# Patient Record
Sex: Male | Born: 1975 | Race: Black or African American | Hispanic: No | Marital: Married | Smoking: Former smoker
Health system: Southern US, Community
[De-identification: ages and names within clinical notes are randomized; demographics above are authoritative.]

## PROBLEM LIST (undated history)

## (undated) DIAGNOSIS — T782XXA Anaphylactic shock, unspecified, initial encounter: Secondary | ICD-10-CM

## (undated) DIAGNOSIS — I10 Essential (primary) hypertension: Secondary | ICD-10-CM

## (undated) DIAGNOSIS — T63441A Toxic effect of venom of bees, accidental (unintentional), initial encounter: Secondary | ICD-10-CM

## (undated) HISTORY — PX: NO PAST SURGERIES: SHX2092

---

## 2007-11-29 ENCOUNTER — Emergency Department: Payer: Self-pay | Admitting: Emergency Medicine

## 2010-04-30 ENCOUNTER — Emergency Department: Payer: Self-pay | Admitting: Emergency Medicine

## 2011-09-27 ENCOUNTER — Emergency Department: Payer: Self-pay | Admitting: *Deleted

## 2011-10-28 ENCOUNTER — Emergency Department: Payer: Self-pay | Admitting: Emergency Medicine

## 2013-05-26 IMAGING — CR DG LUMBAR SPINE 2-3V
1 series · 3 of 3 positions shown · non-contrast
Comparison: none

REASON FOR EXAM: pain
COMMENTS:

PROCEDURE:     DXR - DXR LUMBAR SPINE AP AND LATERAL  - September 27, 2011 [DATE]
RESULT:     Comparison: None

[Series 1: view not recorded · 0.17mm/px · 3 of 3 slices shown]
[im 1/3]
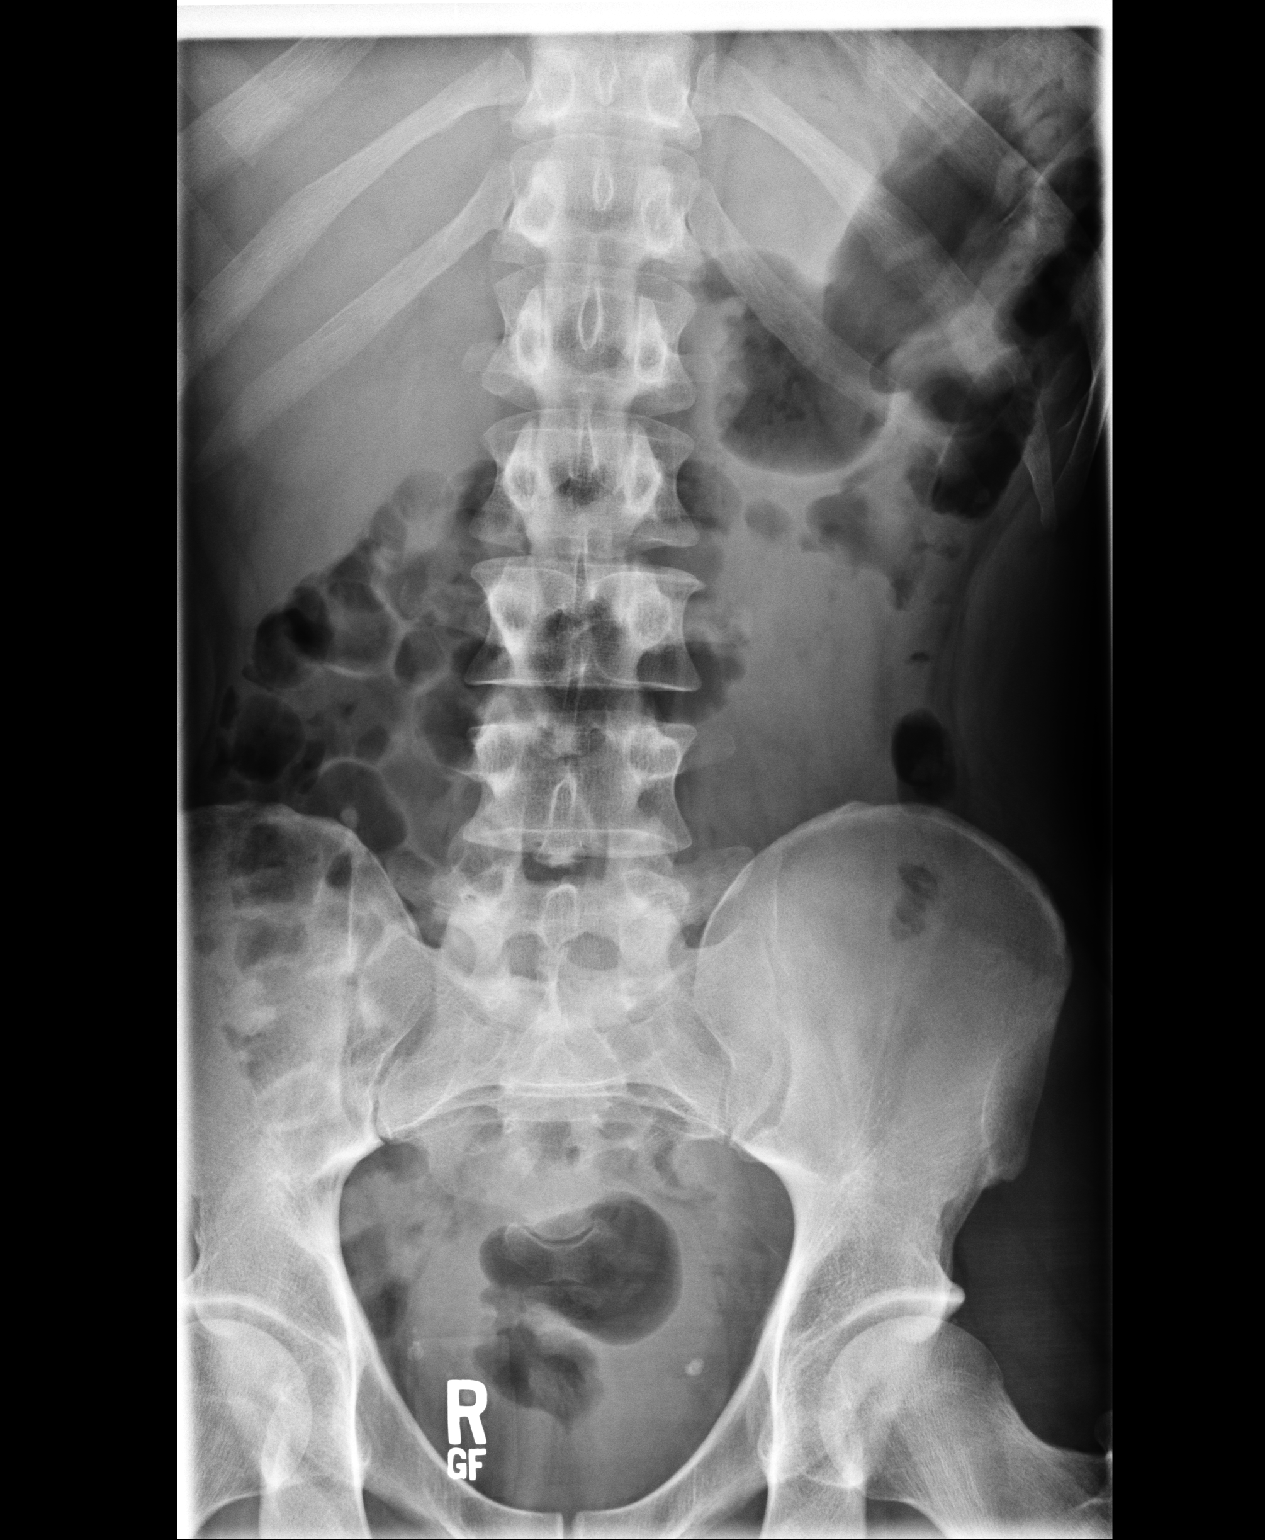
[im 2/3]
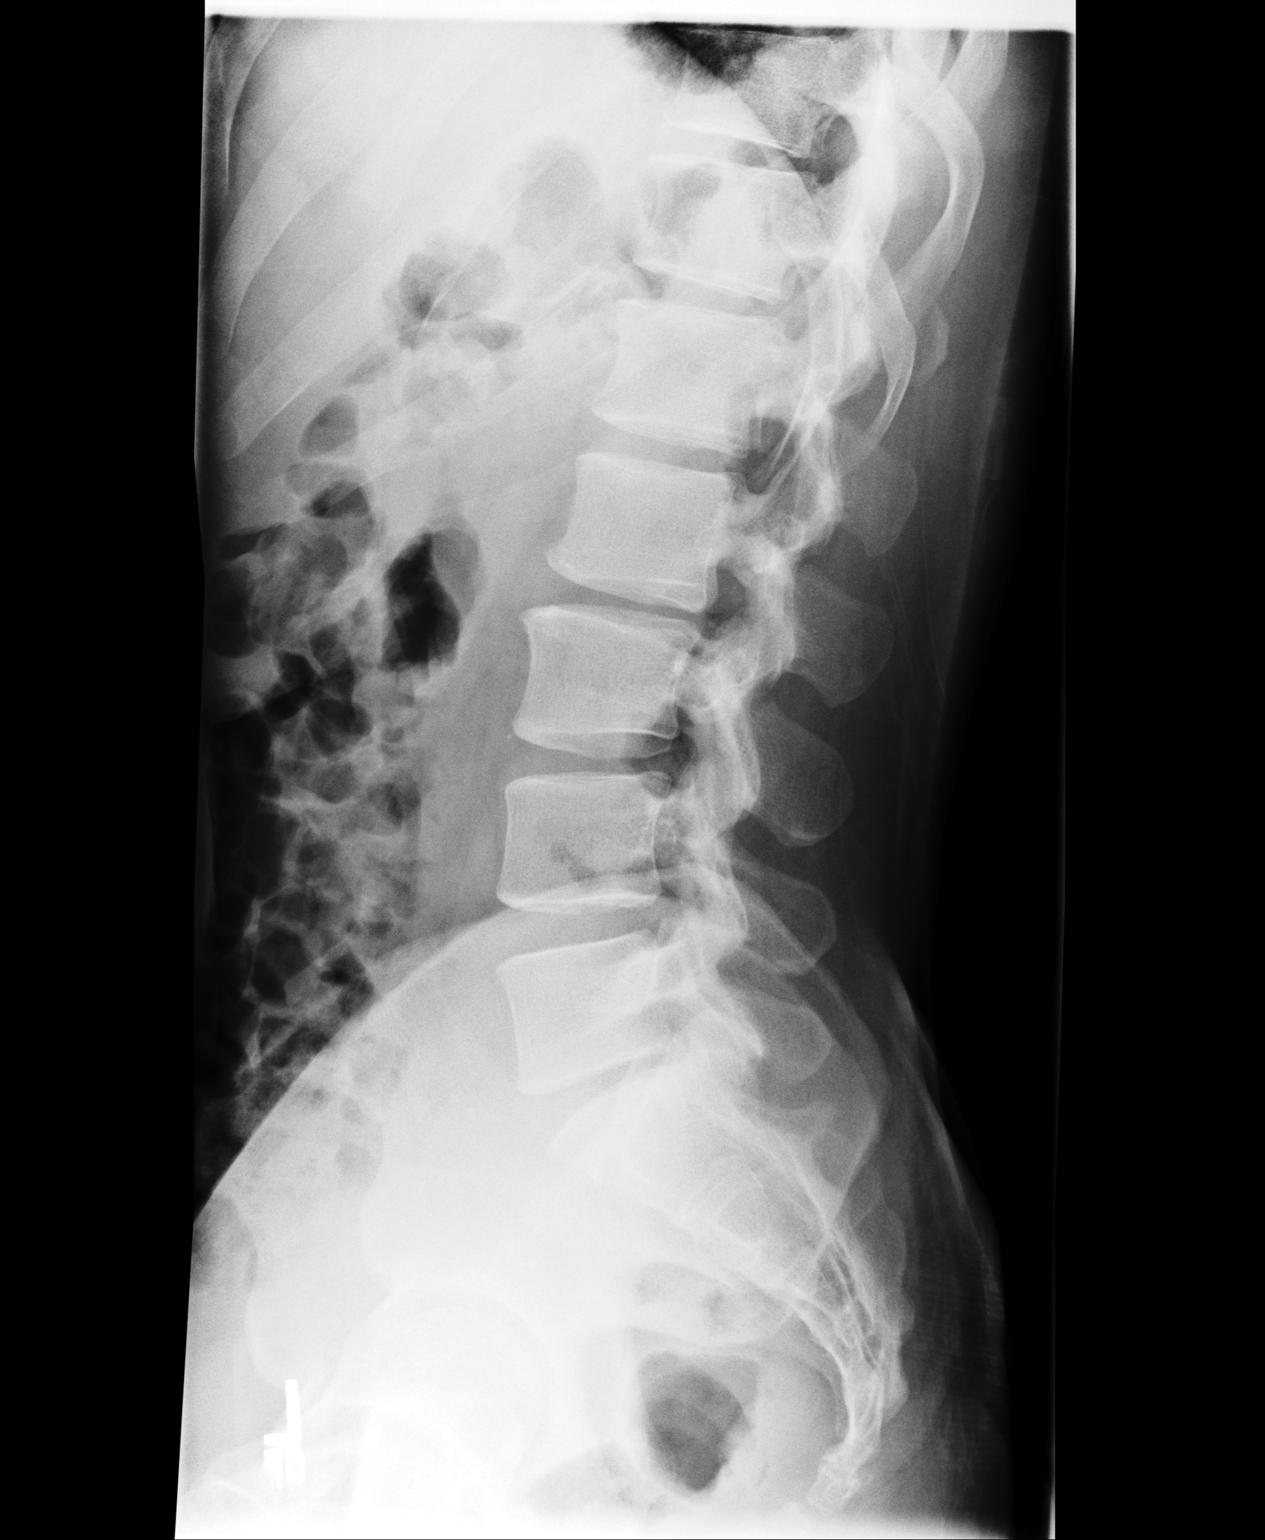
[im 3/3]
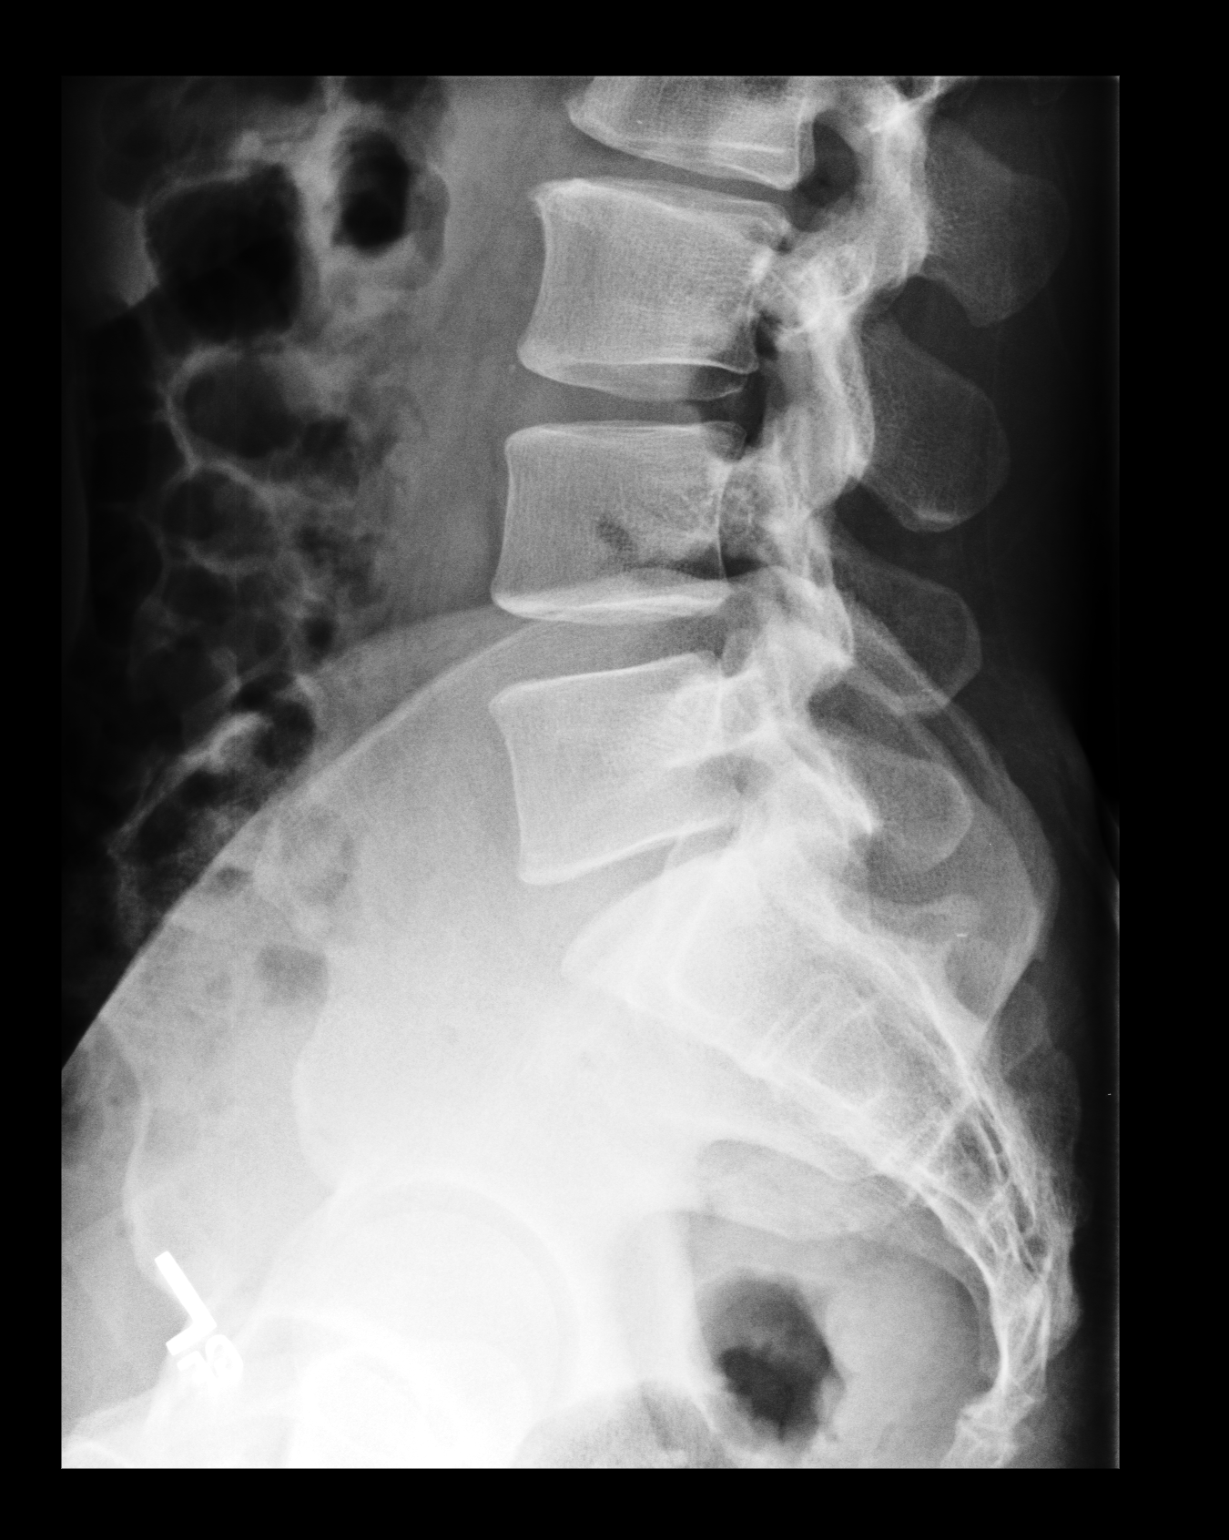

[3 of 3 positions shown; findings below may reference images not displayed]

FINDINGS: AP and lateral views of the lumbar spine and a coned down view of the
lumbosacral junction are provided.

There are 5 nonrib bearing lumbar-type vertebral bodies. The vertebral body
heights are maintained. The alignment is anatomic. There is no
spondylolysis. There is no acute fracture or static listhesis. The disc
spaces are maintained.

The SI joints are unremarkable.
IMPRESSION: 1. No acute osseous abnormality of the lumbar spine.

## 2014-01-04 ENCOUNTER — Emergency Department: Payer: Self-pay | Admitting: Emergency Medicine

## 2014-01-04 LAB — BASIC METABOLIC PANEL
ANION GAP: 6 — AB (ref 7–16)
BUN: 14 mg/dL (ref 7–18)
CO2: 28 mmol/L (ref 21–32)
Calcium, Total: 9.2 mg/dL (ref 8.5–10.1)
Chloride: 102 mmol/L (ref 98–107)
Creatinine: 1.2 mg/dL (ref 0.60–1.30)
EGFR (African American): >60
GLUCOSE: 85 mg/dL (ref 65–99)
Osmolality: 272 (ref 275–301)
Potassium: 3.7 mmol/L (ref 3.5–5.1)
SODIUM: 136 mmol/L (ref 136–145)

## 2014-01-04 LAB — CBC
HCT: 41.9 % (ref 40.0–52.0)
HGB: 13.4 g/dL (ref 13.0–18.0)
MCH: 28.9 pg (ref 26.0–34.0)
MCHC: 32 g/dL (ref 32.0–36.0)
MCV: 90 fL (ref 80–100)
Platelet: 159 10*3/uL (ref 150–440)
RBC: 4.65 10*6/uL (ref 4.40–5.90)
RDW: 14.6 % — AB (ref 11.5–14.5)
WBC: 7.7 10*3/uL (ref 3.8–10.6)

## 2014-01-04 LAB — TROPONIN I: Troponin-I: <0.02 ng/mL

## 2015-01-01 ENCOUNTER — Emergency Department: Payer: Self-pay | Admitting: Emergency Medicine

## 2015-12-21 ENCOUNTER — Observation Stay
Admission: EM | Admit: 2015-12-21 | Discharge: 2015-12-22 | Disposition: A | Discharge status: Home / Self Care | Payer: Self-pay | Attending: Cardiovascular Disease | Admitting: Cardiovascular Disease

## 2015-12-21 ENCOUNTER — Encounter: Payer: Self-pay | Admitting: Emergency Medicine

## 2015-12-21 ENCOUNTER — Emergency Department: Payer: Self-pay

## 2015-12-21 DIAGNOSIS — Z79899 Other long term (current) drug therapy: Secondary | ICD-10-CM | POA: Insufficient documentation

## 2015-12-21 DIAGNOSIS — I959 Hypotension, unspecified: Secondary | ICD-10-CM | POA: Insufficient documentation

## 2015-12-21 DIAGNOSIS — R079 Chest pain, unspecified: Secondary | ICD-10-CM

## 2015-12-21 DIAGNOSIS — M79601 Pain in right arm: Secondary | ICD-10-CM | POA: Insufficient documentation

## 2015-12-21 DIAGNOSIS — Z87891 Personal history of nicotine dependence: Secondary | ICD-10-CM | POA: Insufficient documentation

## 2015-12-21 DIAGNOSIS — M25511 Pain in right shoulder: Secondary | ICD-10-CM | POA: Insufficient documentation

## 2015-12-21 DIAGNOSIS — R7989 Other specified abnormal findings of blood chemistry: Secondary | ICD-10-CM

## 2015-12-21 DIAGNOSIS — R0602 Shortness of breath: Secondary | ICD-10-CM | POA: Insufficient documentation

## 2015-12-21 DIAGNOSIS — Z91030 Bee allergy status: Secondary | ICD-10-CM | POA: Insufficient documentation

## 2015-12-21 DIAGNOSIS — R937 Abnormal findings on diagnostic imaging of other parts of musculoskeletal system: Secondary | ICD-10-CM | POA: Insufficient documentation

## 2015-12-21 DIAGNOSIS — R0789 Other chest pain: Secondary | ICD-10-CM | POA: Insufficient documentation

## 2015-12-21 DIAGNOSIS — S4990XA Unspecified injury of shoulder and upper arm, unspecified arm, initial encounter: Secondary | ICD-10-CM

## 2015-12-21 DIAGNOSIS — R778 Other specified abnormalities of plasma proteins: Secondary | ICD-10-CM | POA: Insufficient documentation

## 2015-12-21 DIAGNOSIS — Z833 Family history of diabetes mellitus: Secondary | ICD-10-CM | POA: Insufficient documentation

## 2015-12-21 DIAGNOSIS — R55 Syncope and collapse: Principal | ICD-10-CM | POA: Insufficient documentation

## 2015-12-21 DIAGNOSIS — Z823 Family history of stroke: Secondary | ICD-10-CM | POA: Insufficient documentation

## 2015-12-21 HISTORY — DX: Toxic effect of venom of bees, accidental (unintentional), initial encounter: T63.441A

## 2015-12-21 HISTORY — DX: Anaphylactic shock, unspecified, initial encounter: T78.2XXA

## 2015-12-21 LAB — BASIC METABOLIC PANEL
Anion gap: 10 (ref 5–15)
BUN: 11 mg/dL (ref 6–20)
CHLORIDE: 109 mmol/L (ref 101–111)
CO2: 23 mmol/L (ref 22–32)
Calcium: 8.8 mg/dL — ABNORMAL LOW (ref 8.9–10.3)
Creatinine, Ser: 0.98 mg/dL (ref 0.61–1.24)
GFR calc Af Amer: >60 mL/min (ref >60)
GFR calc non Af Amer: >60 mL/min (ref >60)
GLUCOSE: 88 mg/dL (ref 65–99)
POTASSIUM: 3.4 mmol/L — AB (ref 3.5–5.1)
Sodium: 142 mmol/L (ref 135–145)

## 2015-12-21 LAB — CBC WITH DIFFERENTIAL/PLATELET
Basophils Absolute: 0.1 10*3/uL (ref 0–0.1)
Basophils Relative: 1 %
Eosinophils Absolute: 0 10*3/uL (ref 0–0.7)
Eosinophils Relative: 0 %
HEMATOCRIT: 39.7 % — AB (ref 40.0–52.0)
HEMOGLOBIN: 12.7 g/dL — AB (ref 13.0–18.0)
LYMPHS ABS: 2.6 10*3/uL (ref 1.0–3.6)
LYMPHS PCT: 20 %
MCH: 27.8 pg (ref 26.0–34.0)
MCHC: 32.1 g/dL (ref 32.0–36.0)
MCV: 86.6 fL (ref 80.0–100.0)
Monocytes Absolute: 1.4 10*3/uL — ABNORMAL HIGH (ref 0.2–1.0)
Monocytes Relative: 11 %
NEUTROS ABS: 8.6 10*3/uL — AB (ref 1.4–6.5)
NEUTROS PCT: 68 %
Platelets: 175 10*3/uL (ref 150–440)
RBC: 4.59 MIL/uL (ref 4.40–5.90)
RDW: 14.1 % (ref 11.5–14.5)
WBC: 12.7 10*3/uL — AB (ref 3.8–10.6)

## 2015-12-21 LAB — TROPONIN I: Troponin I: 0.07 ng/mL — ABNORMAL HIGH (ref <0.031)

## 2015-12-21 MED ORDER — HYDROCODONE-ACETAMINOPHEN 5-325 MG PO TABS: 1.0000 | ORAL_TABLET | ORAL | Status: DC | PRN

## 2015-12-21 MED ORDER — ONDANSETRON HCL 4 MG/2ML IJ SOLN
4.0000 mg | Freq: Once | INTRAMUSCULAR | Status: AC
  Administered 2015-12-21: 4 mg via INTRAVENOUS
  Filled 2015-12-21: qty 2

## 2015-12-21 MED ORDER — SODIUM CHLORIDE 0.9 % IV BOLUS (SEPSIS)
1000.0000 mL | Freq: Once | INTRAVENOUS | Status: AC
  Administered 2015-12-21: 1000 mL via INTRAVENOUS

## 2015-12-21 MED ORDER — ASPIRIN 81 MG PO CHEW
324.0000 mg | CHEWABLE_TABLET | Freq: Once | ORAL | Status: AC
  Administered 2015-12-21: 324 mg via ORAL
  Filled 2015-12-21: qty 4

## 2015-12-21 MED ORDER — HYDROMORPHONE HCL 1 MG/ML IJ SOLN
0.5000 mg | Freq: Once | INTRAMUSCULAR | Status: AC
  Administered 2015-12-21: 0.5 mg via INTRAVENOUS
  Filled 2015-12-21: qty 1

## 2015-12-21 MED ORDER — IBUPROFEN 800 MG PO TABS: 800.0000 mg | ORAL_TABLET | Freq: Three times a day (TID) | ORAL | Status: DC | PRN

## 2015-12-21 MED ORDER — TETANUS-DIPHTH-ACELL PERTUSSIS 5-2.5-18.5 LF-MCG/0.5 IM SUSP
0.5000 mL | Freq: Once | INTRAMUSCULAR | Status: AC
  Administered 2015-12-21: 0.5 mL via INTRAMUSCULAR
  Filled 2015-12-21: qty 0.5
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
## 2015-12-21 NOTE — ED Notes (Signed)
Pt had a near syncopal episode in lobby. Pt is diaphoretic and cool to touch.

## 2015-12-22 ENCOUNTER — Encounter: Payer: Self-pay | Admitting: Internal Medicine

## 2015-12-22 DIAGNOSIS — R55 Syncope and collapse: Secondary | ICD-10-CM | POA: Diagnosis present

## 2015-12-22 DIAGNOSIS — R778 Other specified abnormalities of plasma proteins: Secondary | ICD-10-CM | POA: Diagnosis present

## 2015-12-22 DIAGNOSIS — R7989 Other specified abnormal findings of blood chemistry: Secondary | ICD-10-CM

## 2015-12-22 DIAGNOSIS — M25511 Pain in right shoulder: Secondary | ICD-10-CM | POA: Diagnosis present

## 2015-12-22 LAB — TROPONIN I
TROPONIN I: 0.07 ng/mL — AB (ref <0.031)
Troponin I: 0.05 ng/mL — ABNORMAL HIGH (ref <0.031)

## 2015-12-22 MED ORDER — ONDANSETRON HCL 4 MG PO TABS: 4.0000 mg | ORAL_TABLET | Freq: Four times a day (QID) | ORAL | Status: DC | PRN

## 2015-12-22 MED ORDER — ONDANSETRON HCL 4 MG/2ML IJ SOLN
4.0000 mg | Freq: Four times a day (QID) | INTRAMUSCULAR | Status: DC | PRN
  Administered 2015-12-22 (×2): 4 mg via INTRAVENOUS
  Filled 2015-12-22 (×2): qty 2

## 2015-12-22 MED ORDER — MORPHINE SULFATE (PF) 2 MG/ML IV SOLN
2.0000 mg | INTRAVENOUS | Status: DC | PRN
  Administered 2015-12-22 (×3): 2 mg via INTRAVENOUS
  Filled 2015-12-22 (×3): qty 1
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
## 2015-12-22 NOTE — ED Notes (Signed)
Report given to [place], Charity fundraiser. Explained to oncoming nurse that patient was to be D/C per Dr. Park Breed at this time. Passed on in report that patient received pain prescriptions with prior nights D/C.

## 2015-12-22 NOTE — ED Provider Notes (Signed)
-----------------------------------------   1:44 PM on 12/22/2015 -----------------------------------------  Patient return to emergency department waiting room asking for a doctor's note for his probation officer. He is in a right sided shoulder sling. Per nursing he is asking for a 6 week restriction note. He has appointments for follow-up with cardiology as well as orthopedics. I will give a doctor's note for restriction until he is seen by these physicians and been cleared to return to work.  Myrna Blazer, MD 12/22/15 1345

## 2015-12-22 NOTE — ED Notes (Signed)
Pt is still in ED. Per charge nurse, pt will be here until morning.

## 2015-12-22 NOTE — ED Notes (Signed)
Dr. Kahn at bedside at this time.
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
## 2015-12-22 NOTE — ED Notes (Signed)
Per telemetry, floor is full and they are unable to accept pt. AC notified.
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
## 2016-12-14 ENCOUNTER — Emergency Department
Admission: EM | Admit: 2016-12-14 | Discharge: 2016-12-14 | Disposition: A | Discharge status: Home / Self Care | Payer: Self-pay | Attending: Emergency Medicine | Admitting: Emergency Medicine

## 2016-12-14 ENCOUNTER — Encounter: Payer: Self-pay | Admitting: Emergency Medicine

## 2016-12-14 DIAGNOSIS — Z87891 Personal history of nicotine dependence: Secondary | ICD-10-CM | POA: Insufficient documentation

## 2016-12-14 DIAGNOSIS — R69 Illness, unspecified: Secondary | ICD-10-CM

## 2016-12-14 DIAGNOSIS — R0981 Nasal congestion: Secondary | ICD-10-CM | POA: Insufficient documentation

## 2016-12-14 DIAGNOSIS — M25562 Pain in left knee: Secondary | ICD-10-CM | POA: Insufficient documentation

## 2016-12-14 DIAGNOSIS — R509 Fever, unspecified: Secondary | ICD-10-CM | POA: Insufficient documentation

## 2016-12-14 DIAGNOSIS — J111 Influenza due to unidentified influenza virus with other respiratory manifestations: Secondary | ICD-10-CM

## 2016-12-14 DIAGNOSIS — M25561 Pain in right knee: Secondary | ICD-10-CM | POA: Insufficient documentation

## 2016-12-14 DIAGNOSIS — R05 Cough: Secondary | ICD-10-CM | POA: Insufficient documentation

## 2016-12-14 LAB — INFLUENZA PANEL BY PCR (TYPE A & B)
Influenza A By PCR: NEGATIVE
Influenza B By PCR: NEGATIVE

## 2016-12-14 MED ORDER — NAPROXEN 500 MG PO TBEC: 500.0000 mg | DELAYED_RELEASE_TABLET | Freq: Two times a day (BID) | ORAL | 0 refills | Status: AC

## 2016-12-14 NOTE — ED Provider Notes (Signed)
[facility] Emergency Department Provider Note  ____________________________________________  Time seen: Approximately 8:49 PM  I have reviewed the triage vital signs and the nursing notes.   HISTORY  Chief Complaint Knee Pain    HPI Chase Henderson. is a 41 y.o. male presenting to the emergency department with bilateral arthralgias of the knees. He states that "a few days ago they seemed swollen". Patient states that he has also had fever, persistent headache, nonproductive cough, congestion and runny nose for 1.5 weeks. He states that he has been resting more than usual. Patient does not have a history of chronic knee pain. He rates his knee pain at 6 out of 10 in intensity and describes it as "stiff". Patient has taken ibuprofen for relief. He denies falls or traumas to the knees. He works in Production designer, theatre/television/film. Patient denies chest pain, shortness of breath, chest tightness, abdominal pain, vomiting or diarrhea. Patient states that he had nausea when symptoms first started.   Past Medical History:  Diagnosis Date  . Bee sting-induced anaphylaxis     Patient Active Problem List   Diagnosis Date Noted  . Right shoulder pain 12/22/2015  . Elevated troponin 12/22/2015  . Near syncope 12/22/2015    Past Surgical History:  Procedure Laterality Date  . NO PAST SURGERIES      Prior to Admission medications   Medication Sig Start Date End Date Taking? Authorizing Provider  naproxen (EC NAPROSYN) 500 MG EC tablet Take 1 tablet (500 mg total) by mouth 2 (two) times daily with a meal. 12/14/16 12/21/16  Orvil Feil, PA-C    Allergies Bee venom  Family History  Problem Relation Age of Onset  . Stroke    . Diabetes      Social History Social History  Substance Use Topics  . Smoking status: Former Games developer  . Smokeless tobacco: Former Neurosurgeon  . Alcohol use Yes    Review of Systems  Constitutional: Patient has had fever.  ENT: Patient has had congestion.   Cardiovascular: no chest pain. Respiratory: Patient has had non-productive cough.  No SOB. Gastrointestinal: Has had nausea. Denies diarrhea or constipation. Genitourinary: Negative for dysuria. No hematuria Musculoskeletal: Patient has arthralgias of the knees bilaterally. Skin: Negative for rash, abrasions, lacerations, ecchymosis. Neurological: Negative for headaches, focal weakness or numbness.   ____________________________________________   PHYSICAL EXAM:  VITAL SIGNS: ED Triage Vitals  Enc Vitals Group     BP 12/14/16 1949 (!) 149/92     Pulse Rate 12/14/16 1949 90     Resp 12/14/16 1949 18     Temp 12/14/16 1949 98 F (36.7 C)     Temp Source 12/14/16 1949 Oral     SpO2 12/14/16 1949 96 %     Weight 12/14/16 1950 175 lb (79.4 kg)     Height 12/14/16 1950 6' (1.829 m)     Head Circumference --      Peak Flow --      Pain Score 12/14/16 1950 6     Pain Loc --      Pain Edu? --      Excl. in GC? --     Constitutional: Alert and oriented. Patient was ambulating around the room when I entered. Eyes: Conjunctivae are normal. PERRL. EOMI. Head: Atraumatic. ENT:      Ears: Tympanic membranes are injected without evidence of effusion or purulent exudate. Bony landmarks are visualized bilaterally. No pain with palpation at the tragus.      Nose:  Nasal turbinates are edematous and erythematous. Trace rhinorrhea visualized.      Mouth/Throat: Mucous membranes are moist. Posterior pharynx is mildly erythematous. No tonsillar hypertrophy or purulent exudate. Uvula is midline. Neck: Full range of motion. No pain is elicited with flexion at the neck. Musculoskeletal: Patient's knees are symmetrical and without effusion bilaterally. Patient has 5 out of 5 strength in the lower extremities bilaterally. Patient is able to perform full range of motion at the hip, knee and ankle bilaterally. Patient has no tenderness to palpation along the medial and lateral joint line of the knee  bilaterally. Negative anterior drawer, posterior drawer, ballottement and McMurray's bilaterally. Palpable dorsalis pedis pulses bilaterally. Neurologic:  Normal speech and language. No gross focal neurologic deficits are appreciated. Cranial nerves: 2-10 normal as tested. No sensory loss or abnormal reflexes. Hematological/Lymphatic/Immunilogical: No cervical lymphadenopathy. Cardiovascular: Normal rate, regular rhythm. Normal S1 and S2.  Good peripheral circulation. Respiratory: Normal respiratory effort without tachypnea or retractions. Lungs CTAB. Good air entry to the bases with no decreased or absent breath sounds. Gastrointestinal: Bowel sounds 4 quadrants. Soft and nontender to palpation. No guarding or rigidity. No palpable masses. No distention. No CVA tenderness.  Skin:  Skin is warm, dry and intact. No erythema or edema of the skin overlying the knees. Psychiatric: Mood and affect are normal. Speech and behavior are normal. Patient exhibits appropriate insight and judgement. ____________________________________________   LABS (all labs ordered are listed, but only abnormal results are displayed)  Labs Reviewed  INFLUENZA PANEL BY PCR (TYPE A & B, H1N1)   ____________________________________________  EKG   ____________________________________________  RADIOLOGY   No results found.  ____________________________________________    PROCEDURES  Procedure(s) performed:    Procedures    Medications - No data to display   ____________________________________________   INITIAL IMPRESSION / ASSESSMENT AND PLAN / ED COURSE  Pertinent labs & imaging results that were available during my care of the patient were reviewed by me and considered in my medical decision making (see chart for details).  Review of the  CSRS was performed in accordance of the N[facility] prior to dispensing any controlled drugs.  Clinical Course     Assessment and Plan:  Knee arthralgias  secondary to influenza Patient presents to the emergency department with bilateral knee pain. Additionally, patient states that he has had headache, congestion, rhinorrhea, fatigue and body aches for 1-1/2 weeks. Patient does not have a history of trauma or chronic knee pain. I suspect arthralgias secondary to influenza. Patient was discharged with naproxen for knee pain. Patient education was provided regarding the course of influenza. Rest and hydration were encouraged. Patient was advised to follow up with orthopedics in 7 days if bilateral knee pain persists. Vital signs are reassuring at this time. All patient questions were answered.    ____________________________________________  FINAL CLINICAL IMPRESSION(S) / ED DIAGNOSES  Final diagnoses:  Influenza-like illness  Acute pain of both knees      NEW MEDICATIONS STARTED DURING THIS VISIT:  New Prescriptions   NAPROXEN (EC NAPROSYN) 500 MG EC TABLET    Take 1 tablet (500 mg total) by mouth 2 (two) times daily with a meal.        This chart was dictated using voice recognition software/Dragon. Despite best efforts to proofread, errors can occur which can change the meaning. Any change was purely unintentional.    Orvil FeilJaclyn M Canyon Willow, PA-C 12/14/16 2133    Minna AntisKevin Paduchowski, MD 12/16/16 2300

## 2016-12-14 NOTE — ED Notes (Signed)
Pt c/o bil knee pain chronic with no injury. Has not taken any tylenol or motrin for pain today. States had fluid in his knee yesterday but it went away.

## 2016-12-14 NOTE — ED Triage Notes (Signed)
Pt c/o bilateral knee pain for weeks; says he stands a lot for his job; a few days ago both knees were swollen and work but denies swelling at this time; no injury; pain worse with movement

## 2016-12-16 ENCOUNTER — Encounter: Payer: Self-pay | Admitting: Emergency Medicine

## 2016-12-16 ENCOUNTER — Emergency Department
Admission: EM | Admit: 2016-12-16 | Discharge: 2016-12-16 | Disposition: A | Discharge status: Home / Self Care | Payer: Self-pay | Attending: Emergency Medicine | Admitting: Emergency Medicine

## 2016-12-16 DIAGNOSIS — M25462 Effusion, left knee: Secondary | ICD-10-CM | POA: Insufficient documentation

## 2016-12-16 DIAGNOSIS — Z791 Long term (current) use of non-steroidal anti-inflammatories (NSAID): Secondary | ICD-10-CM | POA: Insufficient documentation

## 2016-12-16 DIAGNOSIS — Z87891 Personal history of nicotine dependence: Secondary | ICD-10-CM | POA: Insufficient documentation

## 2016-12-16 DIAGNOSIS — M25461 Effusion, right knee: Secondary | ICD-10-CM | POA: Insufficient documentation

## 2016-12-16 MED ORDER — MELOXICAM 15 MG PO TABS: 15.0000 mg | ORAL_TABLET | Freq: Every day | ORAL | 0 refills | Status: AC

## 2016-12-16 MED ORDER — PREDNISONE 10 MG PO TABS: 40.0000 mg | ORAL_TABLET | Freq: Every day | ORAL | 0 refills | Status: AC

## 2016-12-16 NOTE — ED Provider Notes (Signed)
[facility] Emergency Department Provider Note  ____________________________________________  Time seen: Approximately 11:48 PM  I have reviewed the triage vital signs and the nursing notes.   HISTORY  Chief Complaint Joint Swelling    HPI Chase Henderson Vital. is a 41 y.o. male presents to the emergency department with 3 weeks of bilateral knee swelling. Patient states that he is on his feet and knees all day for work. Patient works in Production designer, theatre/television/film. Swelling is worse when patient is on his feet. Knees are painful when patient is up walking around. Patient denies any trauma. No erythema or rashes. Patient has not taken anything for symptoms. Patient denies drug use. No history of arthritis. No fever, shortness of breath, chest pain, abdominal pain.   Past Medical History:  Diagnosis Date  . Bee sting-induced anaphylaxis     Patient Active Problem List   Diagnosis Date Noted  . Right shoulder pain 12/22/2015  . Elevated troponin 12/22/2015  . Near syncope 12/22/2015    Past Surgical History:  Procedure Laterality Date  . NO PAST SURGERIES      Prior to Admission medications   Medication Sig Start Date End Date Taking? Authorizing Provider  meloxicam (MOBIC) 15 MG tablet Take 1 tablet (15 mg total) by mouth daily. 12/16/16 12/26/16  Enid Derry, PA-C  naproxen (EC NAPROSYN) 500 MG EC tablet Take 1 tablet (500 mg total) by mouth 2 (two) times daily with a meal. 12/14/16 12/21/16  Orvil Feil, PA-C  predniSONE (DELTASONE) 10 MG tablet Take 4 tablets (40 mg total) by mouth daily. 12/16/16 12/21/16  Enid Derry, PA-C    Allergies Bee venom  Family History  Problem Relation Age of Onset  . Stroke    . Diabetes      Social History Social History  Substance Use Topics  . Smoking status: Former Games developer  . Smokeless tobacco: Former Neurosurgeon  . Alcohol use Yes     Review of Systems  Constitutional: No fever/chills ENT: No upper respiratory  complaints. Cardiovascular: No chest pain. Respiratory: No cough. No SOB. Gastrointestinal: No abdominal pain.  No nausea, no vomiting.  Skin: Negative for rash, abrasions, lacerations, ecchymosis. Neurological: Negative for headaches, numbness or tingling   ____________________________________________   PHYSICAL EXAM:  VITAL SIGNS: ED Triage Vitals  Enc Vitals Group     BP 12/16/16 2058 140/90     Pulse Rate 12/16/16 2058 88     Resp 12/16/16 2058 16     Temp 12/16/16 2058 98 F (36.7 C)     Temp Source 12/16/16 2058 Oral     SpO2 12/16/16 2058 100 %     Weight 12/16/16 2058 175 lb (79.4 kg)     Height 12/16/16 2058 6' (1.829 m)     Head Circumference --      Peak Flow --      Pain Score 12/16/16 2232 7     Pain Loc --      Pain Edu? --      Excl. in GC? --      Constitutional: Alert and oriented. Well appearing and in no acute distress. Eyes: Conjunctivae are normal. PERRL. EOMI. Head: Atraumatic. ENT:      Ears:      Nose: No congestion/rhinnorhea.      Mouth/Throat: Mucous membranes are moist.  Neck: No stridor.  Cardiovascular: Normal rate, regular rhythm. Normal S1 and S2.  Good peripheral circulation. 2+ dorsalis pedis and posterior tibialis pulses. Respiratory: Normal respiratory effort without  tachypnea or retractions. Lungs CTAB. Good air entry to the bases with no decreased or absent breath sounds. Musculoskeletal: Full range of motion to all extremities. Bilateral knee effusions. No tenderness to palpation. No calf tenderness.  Neurologic:  Normal speech and language. No gross focal neurologic deficits are appreciated.  Skin:  Skin is warm, dry and intact. No rash noted. Psychiatric: Mood and affect are normal. Speech and behavior are normal. Patient exhibits appropriate insight and judgement.   ____________________________________________   LABS (all labs ordered are listed, but only abnormal results are displayed)  Labs Reviewed - No data to  display ____________________________________________  EKG   ____________________________________________  RADIOLOGY   No results found.  ____________________________________________    PROCEDURES  Procedure(s) performed:    Procedures    Medications - No data to display   ____________________________________________   INITIAL IMPRESSION / ASSESSMENT AND PLAN / ED COURSE  Pertinent labs & imaging results that were available during my care of the patient were reviewed by me and considered in my medical decision making (see chart for details).  Review of the [place] CSRS was performed in accordance of the [facility] prior to dispensing any controlled drugs.  Clinical Course     Patient presents to emergency department with 2 weeks of bilateral knee effusions. No indication of infection. Patient is on his feet and knees for work all day. Patient is to follow up with orthopedics for workup for arthritis and to evaluate synovial fluid if necessary. Patient will be discharged home with prescriptions for prednisone and meloxicam. Patient is given ED precautions to return to the ED for any worsening or new symptoms.     ____________________________________________  FINAL CLINICAL IMPRESSION(S) / ED DIAGNOSES  Final diagnoses:  Bilateral knee effusions      NEW MEDICATIONS STARTED DURING THIS VISIT:  Discharge Medication List as of 12/16/2016 11:21 PM    START taking these medications   Details  meloxicam (MOBIC) 15 MG tablet Take 1 tablet (15 mg total) by mouth daily., Starting Tue 12/16/2016, Until Fri 12/26/2016, Print    predniSONE (DELTASONE) 10 MG tablet Take 4 tablets (40 mg total) by mouth daily., Starting Tue 12/16/2016, Until Sun 12/21/2016, Print            This chart was dictated using voice recognition software/Dragon. Despite best efforts to proofread, errors can occur which can change the meaning. Any change was purely unintentional.    Enid DerryAshley Amalea Ottey,  PA-C 12/16/16 2356    Minna AntisKevin Paduchowski, MD 12/20/16 [phone]

## 2016-12-16 NOTE — ED Triage Notes (Signed)
Pt in wheelchair to triage. Pt c/o bilateral knee swelling and pain x3 weeks, was seen in ED on Sunday and told knee pain was "associated with flu symptoms." Pt reports today his knees gave out on him while at work, here to ED for further evaluation.

## 2020-09-10 ENCOUNTER — Encounter: Payer: Self-pay | Admitting: Emergency Medicine

## 2020-09-10 ENCOUNTER — Emergency Department
Admission: EM | Admit: 2020-09-10 | Discharge: 2020-09-10 | Disposition: A | Discharge status: Home / Self Care | Payer: Self-pay | Attending: Emergency Medicine | Admitting: Emergency Medicine

## 2020-09-10 ENCOUNTER — Other Ambulatory Visit: Payer: Self-pay

## 2020-09-10 DIAGNOSIS — I10 Essential (primary) hypertension: Secondary | ICD-10-CM | POA: Insufficient documentation

## 2020-09-10 DIAGNOSIS — S61304A Unspecified open wound of right ring finger with damage to nail, initial encounter: Secondary | ICD-10-CM | POA: Insufficient documentation

## 2020-09-10 DIAGNOSIS — S61309A Unspecified open wound of unspecified finger with damage to nail, initial encounter: Secondary | ICD-10-CM

## 2020-09-10 DIAGNOSIS — Z87891 Personal history of nicotine dependence: Secondary | ICD-10-CM | POA: Insufficient documentation

## 2020-09-10 DIAGNOSIS — W230XXA Caught, crushed, jammed, or pinched between moving objects, initial encounter: Secondary | ICD-10-CM | POA: Insufficient documentation

## 2020-09-10 DIAGNOSIS — Z23 Encounter for immunization: Secondary | ICD-10-CM | POA: Insufficient documentation

## 2020-09-10 DIAGNOSIS — Y990 Civilian activity done for income or pay: Secondary | ICD-10-CM | POA: Insufficient documentation

## 2020-09-10 HISTORY — DX: Essential (primary) hypertension: I10

## 2020-09-10 MED ORDER — TETANUS-DIPHTH-ACELL PERTUSSIS 5-2.5-18.5 LF-MCG/0.5 IM SUSP
0.5000 mL | Freq: Once | INTRAMUSCULAR | Status: AC
  Administered 2020-09-10: 0.5 mL via INTRAMUSCULAR
  Filled 2020-09-10: qty 0.5

## 2020-09-10 MED ORDER — LIDOCAINE HCL (PF) 1 % IJ SOLN
5.0000 mL | Freq: Once | INTRAMUSCULAR | Status: AC
  Administered 2020-09-10: 5 mL
  Filled 2020-09-10 (×2): qty 5

## 2020-09-10 NOTE — Discharge Instructions (Signed)
Keep the areas clean and dry as possible.  For the peeling on your hands, try over-the-counter antifungal creams.  Return emergency department for any sign of infection.  Will take approximately 1 year for your nail to grow out to be normal again.

## 2020-09-10 NOTE — ED Provider Notes (Signed)
[facility] Emergency Department Provider Note  ____________________________________________   First MD Initiated Contact with Patient 09/10/20 1703     (approximate)  I have reviewed the triage vital signs and the nursing notes.   HISTORY  Chief Complaint Finger Injury    HPI Chase Henderson. is a 44 y.o. male presents emergency department stating he smashed his right ring finger at work yesterday causing the nail to lift up.  Would like for [place] to remove his nail.  Unsure of his last tetanus.    Past Medical History:  Diagnosis Date  . Bee sting-induced anaphylaxis   . Hypertension     Patient Active Problem List   Diagnosis Date Noted  . Right shoulder pain 12/22/2015  . Elevated troponin 12/22/2015  . Near syncope 12/22/2015    Past Surgical History:  Procedure Laterality Date  . NO PAST SURGERIES      Prior to Admission medications   Not on File    Allergies Bee venom  Family History  Problem Relation Age of Onset  . Stroke Other   . Diabetes Other     Social History Social History   Tobacco Use  . Smoking status: Former Games developer  . Smokeless tobacco: Former Engineer, water Use Topics  . Alcohol use: Yes  . Drug use: No    Review of Systems  Constitutional: No fever/chills Eyes: No visual changes. ENT: No sore throat. Respiratory: Denies cough Genitourinary: Negative for dysuria. Musculoskeletal: Negative for back pain. Skin: Negative for rash. Psychiatric: no mood changes,     ____________________________________________   PHYSICAL EXAM:  VITAL SIGNS: ED Triage Vitals  Enc Vitals Group     BP 09/10/20 1559 (!) 184/120     Pulse Rate 09/10/20 1556 77     Resp 09/10/20 1556 20     Temp 09/10/20 1556 97.9 F (36.6 C)     Temp Source 09/10/20 1556 Oral     SpO2 09/10/20 1556 95 %     Weight 09/10/20 1558 180 lb (81.6 kg)     Height 09/10/20 1558 6' (1.829 m)     Head Circumference --      Peak  Flow --      Pain Score 09/10/20 1558 3     Pain Loc --      Pain Edu? --      Excl. in GC? --     Constitutional: Alert and oriented. Well appearing and in no acute distress. Eyes: Conjunctivae are normal.  Head: Atraumatic. Nose: No congestion/rhinnorhea. Mouth/Throat: Mucous membranes are moist.   Neck:  supple no lymphadenopathy noted Cardiovascular: Normal rate, regular rhythm.  Respiratory: Normal respiratory effort.  No retractions,  GU: deferred Musculoskeletal: FROM all extremities, warm and well perfused, nail on the right fourth finger is lifted, no bleeding is noted, neurovascular intact Neurologic:  Normal speech and language.  Skin:  Skin is warm, dry and intact. No rash noted. Psychiatric: Mood and affect are normal. Speech and behavior are normal.  ____________________________________________   LABS (all labs ordered are listed, but only abnormal results are displayed)  Labs Reviewed - No data to display ____________________________________________   ____________________________________________  RADIOLOGY    ____________________________________________   PROCEDURES  Procedure(s) performed:   .Nail Removal  Date/Time: 09/10/2020 5:51 PM Performed by: Faythe Ghee, PA-C Authorized by: Faythe Ghee, PA-C   Consent:    Consent obtained:  Verbal   Consent given by:  Patient   Risks discussed:  Bleeding, incomplete removal, infection, pain and permanent nail deformity Location:    Hand:  R ring finger Pre-procedure details:    Skin preparation:  Alcohol Anesthesia (see MAR for exact dosages):    Anesthesia method:  Nerve block   Block needle gauge:  25 G   Block anesthetic:  Lidocaine 1% w/o epi   Block injection procedure:  Anatomic landmarks identified, introduced needle, incremental injection, anatomic landmarks palpated and negative aspiration for blood   Block outcome:  Anesthesia achieved Nail Removal:    Nail removed:  Complete    Nail bed repaired: no     Removed nail replaced and anchored: no   Post-procedure details:    Dressing:  4x4 sterile gauze   Patient tolerance of procedure:  Tolerated well, no immediate complications Comments:     Removed by Gigi Gin, PA student      ____________________________________________   INITIAL IMPRESSION / ASSESSMENT AND PLAN / ED COURSE  Pertinent labs & imaging results that were available during my care of the patient were reviewed by me and considered in my medical decision making (see chart for details).   Patient is a 44 year old male presents emergency department in need of nail removal.  See HPI.  Physical exam shows the right fourth finger to have mostly avulsed fingernail.  See procedure note for nail removal.  Dressing was applied by nursing staff.  Tdap was updated for the patient.  He was discharged in stable condition with instructions to return if worsening.     Chase Henderson. was evaluated in Emergency Department on 09/10/2020 for the symptoms described in the history of present illness. He was evaluated in the context of the global COVID-19 pandemic, which necessitated consideration that the patient might be at risk for infection with the SARS-CoV-2 virus that causes COVID-19. Institutional protocols and algorithms that pertain to the evaluation of patients at risk for COVID-19 are in a state of rapid change based on information released by regulatory bodies including the CDC and federal and state organizations. These policies and algorithms were followed during the patient's care in the ED.    As part of my medical decision making, I reviewed the following data within the electronic MEDICAL RECORD NUMBER Nursing notes reviewed and incorporated, Old chart reviewed, Notes from prior ED visits and  Controlled Substance Database  ____________________________________________   FINAL CLINICAL IMPRESSION(S) / ED DIAGNOSES  Final diagnoses:  Nail  avulsion, finger, initial encounter      NEW MEDICATIONS STARTED DURING THIS VISIT:  New Prescriptions   No medications on file     Note:  This document was prepared using Dragon voice recognition software and may include unintentional dictation errors.    Faythe Ghee, PA-C 09/10/20 1755    Chesley Noon, MD 09/10/20 [phone]

## 2020-09-10 NOTE — ED Triage Notes (Signed)
Pt presents to ED via POV with c/o R 4th digit nail injury. Pt states yellowing to R thumb nail as well, however is here to be seen for 4th digit finger nail breaking off.

## 2020-09-10 NOTE — ED Notes (Signed)
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  States his finger nail is about to come off.  No bleeding at this time.  Ambulatory to treatment room. NAD noted.

## 2020-09-10 NOTE — ED Notes (Signed)
Darl Pikes PA aware of SBP>180, ok for d/c

## 2023-09-15 ENCOUNTER — Ambulatory Visit: Payer: Self-pay
